# Patient Record
Sex: Female | Born: 2005 | Race: White | Hispanic: Yes | Marital: Single | Smoking: Never smoker
Health system: Southern US, Community
[De-identification: ages and names within clinical notes are randomized; demographics above are authoritative.]

## PROBLEM LIST (undated history)

## (undated) DIAGNOSIS — Z0101 Encounter for examination of eyes and vision with abnormal findings: Secondary | ICD-10-CM

## (undated) DIAGNOSIS — Z8709 Personal history of other diseases of the respiratory system: Secondary | ICD-10-CM

## (undated) DIAGNOSIS — N390 Urinary tract infection, site not specified: Secondary | ICD-10-CM

## (undated) HISTORY — DX: Urinary tract infection, site not specified: N39.0

## (undated) HISTORY — DX: Encounter for examination of eyes and vision with abnormal findings: Z01.01

## (undated) HISTORY — DX: Personal history of other diseases of the respiratory system: Z87.09

---
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
## 2023-01-02 ENCOUNTER — Encounter: Payer: Self-pay | Admitting: Family

## 2023-01-02 ENCOUNTER — Ambulatory Visit (INDEPENDENT_AMBULATORY_CARE_PROVIDER_SITE_OTHER): Payer: Medicaid Other | Admitting: Family

## 2023-01-02 ENCOUNTER — Other Ambulatory Visit (HOSPITAL_BASED_OUTPATIENT_CLINIC_OR_DEPARTMENT_OTHER): Payer: Medicaid Other

## 2023-01-02 VITALS — BP 111/73 | HR 77 | Ht <= 58 in | Wt 136.2 lb

## 2023-01-02 DIAGNOSIS — L700 Acne vulgaris: Secondary | ICD-10-CM

## 2023-01-02 DIAGNOSIS — Z3042 Encounter for surveillance of injectable contraceptive: Secondary | ICD-10-CM | POA: Diagnosis not present

## 2023-01-02 DIAGNOSIS — L309 Dermatitis, unspecified: Secondary | ICD-10-CM

## 2023-01-02 MED ORDER — MEDROXYPROGESTERONE ACETATE 150 MG/ML IM SUSP
150.0000 mg | Freq: Once | INTRAMUSCULAR | Status: AC
Start: 2023-01-02 — End: 2023-01-02
  Administered 2023-01-02: 150 mg via INTRAMUSCULAR

## 2023-01-02 MED ORDER — CLINDAMYCIN PHOS-BENZOYL PEROX 1.2-5 % EX GEL: 1.0000 | Freq: Every day | CUTANEOUS | 5 refills | Status: DC

## 2023-01-02 MED ORDER — HYDROCORTISONE 0.5 % EX OINT
1.0000 | TOPICAL_OINTMENT | Freq: Two times a day (BID) | CUTANEOUS | 0 refills | Status: DC
Start: 2023-01-02 — End: 2023-01-03

## 2023-01-02 NOTE — Patient Instructions (Addendum)
It was great to see you today! Return if those spots worsen of if you notice new spots in other areas.  Try Hydrocortisone cream on the affected areas. Let me know how it goes. You can send me a My Chart message!   Return in 12 weeks for Depo again or sooner if needed!   Take Care!  [employer]

## 2023-01-02 NOTE — Progress Notes (Signed)
History was provided by the patient.  Sophia [name] is a 17 y.o. female who is here for Depo provera and acne follow-up.   PCP confirmed? Yes.    Ettefagh, Aron Baba, MD  Plan from last visit:  1. Encounter for general adult medical examination without abnormal findings Discussed importance of adequate sleep.  Recommend discussing sleep schedule with roommate and consider ear plugs and eye covers to help with sleep.    2. BMI (body mass index), pediatric, 85% to less than 95% for age   22. Routine screening for STI (sexually transmitted infection) - Urine cytology ancillary only   4. Screening for human immunodeficiency virus - POCT Rapid HIV - negative   5. Negative pregnancy test - POCT urine pregnancy   6. Surveillance of contraceptive injection Discussed possibility of early pregnancy given that she had unprotected sex about 1 week ago.  Discussed risks of depo administration in early pregnancy - patient would like to proceed with Depo today.  Recommend use of condoms for the next 7 days and repeat home pregnancy test in 2-4 weeks - medroxyPROGESTERone (DEPO-PROVERA) injection 150 mg - DG Bone Density   7. Seasonal allergies - cetirizine (ZYRTEC) 10 MG tablet; Take 1 tablet (10 mg total) by mouth daily.  Dispense: 30 tablet; Refill: 2 - fluticasone (FLONASE) 50 MCG/ACT nasal spray; Place 1-2 sprays into both nostrils daily.  Dispense: 16 g; Refill: 12   8. Acne vulgaris Discussed acne treatment options.  Rx provided.  Will recheck acne at Depo follow-up appointment when she is on winter break or sooner as needed. - Clindamycin-Benzoyl Per, Refr, gel; Apply 1 Application topically daily.  Dispense: 45 g; Refill: 5   9. Migraine without aura and without status migrainosus, not intractable Discussed importance of adequate sleep to reduce migraine frequency No red flags for elevated ICP.  Start magnesium oxide supplement to help reduce headache fequency.  Follow-up with  neurologist if headaches worsen or fail to improving.     Hearing screening result:normal Vision screening result: normal   Counseling provided for all of the vaccine components     Orders Placed This Encounter  Procedures   Flu vaccine trivalent PF, 6mos and older(Flulaval,Afluria,Fluarix,Fluzone)   Meningococcal B, OMV      Return for follow-up acne and depo in 3 months with Bernell List     HPI:  -uses benzaclin gel once daily after shower  -gets tiny  bumps on forehead + some irritation on her eyelids at time -using charcoal face wash and something her mom's friend gave her   -has a little rash on stomach x 2 months; gets better then worse; at first itched and then it would hurt when she would scratch it so she stopped scratching it; only used lotion for it; thinks she has a little on her L thigh   LMP ended last week, started around Thanksgiving; seems like it was shorter than normal -skipped before that; wants to continue with Depo; gets cramping but not too bad    Patient Active Problem List   Diagnosis Date Noted   Seasonal allergic rhinitis due to pollen 05/14/2020   Migraine without aura and without status migrainosus, not intractable 04/26/2020   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     cetirizine (ZYRTEC) 10 MG tablet Take 1 tablet (10 mg total) by mouth daily. 30 tablet 2   Clindamycin-Benzoyl Per, Refr, gel Apply 1 Application topically daily. 45 g 5   famotidine (PEPCID) 20 MG tablet Take 1 tablet (20 mg total) by mouth daily.  (Patient not taking: Reported on 01/13/2022) 30 tablet 2   fluticasone (FLONASE) 50 MCG/ACT nasal spray Place 1-2 sprays into both nostrils daily. 16 g 12   ibuprofen (ADVIL) 200 MG tablet Take 200 mg by mouth every 6 (six) hours as needed.     melatonin 1 MG TABS tablet Take 3 mg by mouth at bedtime. (Patient not taking: Reported on 10/21/2021)     naproxen (NAPROSYN) 250 MG tablet Take 1 tablet (250 mg total) by mouth 2 (two) times daily with a meal. As needed for menstrual cramps. (Patient not taking: Reported on 10/21/2021) 30 tablet 1   No current facility-administered medications on file prior to visit.    No Known Allergies  Physical Exam:    Vitals:   01/02/23 1335  BP: 111/73  Pulse: 77  Weight: 136 lb 3.2 oz (61.8 kg)  Height: 4\' 10"  (1.473 m)    Blood pressure %iles are not available for patients who are 18 years or older. No LMP recorded.  Physical Exam Constitutional:      General: She is not in acute distress.    Appearance: She is well-developed.  HENT:     Head: Normocephalic and atraumatic.  Eyes:     General: No scleral icterus.    Pupils: Pupils are equal, round, and reactive to light.  Neck:     Thyroid: No thyromegaly.  Cardiovascular:     Rate and Rhythm: Normal rate and regular rhythm.     Heart sounds: Normal heart sounds. No murmur heard. Pulmonary:     Effort: Pulmonary effort is normal.     Breath sounds: Normal breath sounds.  Abdominal:     Palpations: Abdomen is soft.  Musculoskeletal:        General: Normal range of motion.     Cervical back: Normal range of motion and neck supple.  Lymphadenopathy:     Cervical: No cervical adenopathy.  Skin:    General: Skin is warm and dry.     Findings: No rash.     Comments: Small dry erythamatous patch approx 2 x 4 cm on L of umbilicus, same size and description on L groin area; intact, no sign of infection  Neurological:     Mental Status: She is alert and oriented to person, place, and time.      Cranial Nerves: No cranial nerve deficit.  Psychiatric:        Behavior: Behavior normal.        Thought Content: Thought content normal.        Judgment: Judgment normal.      Assessment/Plan:  1. Acne vulgaris (Primary) -doing well, advised to switch face wash to Cetaphil Face Wash instead; continue with Benzaclin - Clindamycin-Benzoyl Per, Refr, gel; Apply 1 Application topically daily.  Dispense: 45 g; Refill: 5  2. Encounter for Depo-Provera contraception -continue on depo window  - medroxyPROGESTERone (DEPO-PROVERA) injection 150 mg  3. Eczema, unspecified type -try hydrocortisone, return precautions reviewed for new or worsening  - hydrocortisone  ointment 0.5 %; Apply 1 Application topically 2 (two) times daily.  Dispense: 30 g; Refill: 0

## 2023-01-03 ENCOUNTER — Other Ambulatory Visit: Payer: Self-pay | Admitting: Family

## 2023-01-03 ENCOUNTER — Ambulatory Visit (HOSPITAL_BASED_OUTPATIENT_CLINIC_OR_DEPARTMENT_OTHER)
Admission: RE | Admit: 2023-01-03 | Discharge: 2023-01-03 | Disposition: A | Discharge status: Home / Self Care | Payer: Medicaid Other | Source: Ambulatory Visit | Attending: Pediatrics | Admitting: Pediatrics

## 2023-01-03 DIAGNOSIS — L309 Dermatitis, unspecified: Secondary | ICD-10-CM

## 2023-01-03 DIAGNOSIS — Z3042 Encounter for surveillance of injectable contraceptive: Secondary | ICD-10-CM | POA: Insufficient documentation

## 2023-01-03 DIAGNOSIS — M810 Age-related osteoporosis without current pathological fracture: Secondary | ICD-10-CM | POA: Diagnosis not present

## 2023-02-06 ENCOUNTER — Encounter: Payer: Self-pay | Admitting: Family

## 2023-02-07 ENCOUNTER — Encounter: Payer: Medicaid Other | Admitting: Family

## 2023-02-08 ENCOUNTER — Telehealth: Payer: Medicaid Other | Admitting: Family

## 2023-02-08 ENCOUNTER — Encounter: Payer: Self-pay | Admitting: Family

## 2023-02-08 DIAGNOSIS — L700 Acne vulgaris: Secondary | ICD-10-CM

## 2023-02-08 DIAGNOSIS — Z3042 Encounter for surveillance of injectable contraceptive: Secondary | ICD-10-CM

## 2023-02-08 MED ORDER — ADAPALENE-BENZOYL PEROXIDE 0.1-2.5 % EX GEL: CUTANEOUS | 1 refills | Status: DC

## 2023-02-08 NOTE — Progress Notes (Signed)
THIS RECORD MAY CONTAIN CONFIDENTIAL INFORMATION THAT SHOULD NOT BE RELEASED WITHOUT REVIEW OF THE SERVICE PROVIDER.  Virtual Follow-Up Visit via Video Note  I connected with Sophia [name]   on 02/08/23 at  5:00 PM EST by a video enabled telemedicine application and verified that I am speaking with the correct person using two identifiers.   Patient/parent location: [facility]  Provider location: remote [place]   I discussed the limitations of evaluation and management by telemedicine and the availability of in person appointments.  I discussed that the purpose of this telehealth visit is to provide medical care while limiting exposure to the novel coronavirus.  The patient expressed understanding and agreed to proceed.   Sophia [name] is a 18 y.o. female referred by Ettefagh, Aron Baba, MD here today for follow-up of acne vulgaris.  History was provided by the patient.  Supervising Physician: Dr. Theadore Nan   Plan from Last Visit:   1. Acne vulgaris (Primary) -doing well, advised to switch face wash to Cetaphil Face Wash instead; continue with Benzaclin - Clindamycin-Benzoyl Per, Refr, gel; Apply 1 Application topically daily.  Dispense: 45 g; Refill: 5   2. Encounter for Depo-Provera contraception -continue on depo window  - medroxyPROGESTERone (DEPO-PROVERA) injection 150 mg   3. Eczema, unspecified type -try hydrocortisone, return precautions reviewed for new or worsening  - hydrocortisone ointment 0.5 %; Apply 1 Application topically 2 (two) times daily.  Dispense: 30 g; Refill: 0  Chief Complaint:   History of Present Illness:  -forgot it when she came back to [name] from break  -using a charcoal cleanser and a cream  -no period since Depo, no cramping; needs to change next appt date  -would like to try something different; has used benzaclin for a while and still having breakouts; feels her skin is too oily to use Cetaphil Gentle Cleanser   No  Known Allergies Outpatient Medications Prior to Visit  Medication Sig Dispense Refill   cetirizine (ZYRTEC) 10 MG tablet Take 1 tablet (10 mg total) by mouth daily. 30 tablet 2   Clindamycin-Benzoyl Per, Refr, gel Apply 1 Application topically daily. 45 g 5   fluticasone (FLONASE) 50 MCG/ACT nasal spray Place 1-2 sprays into both nostrils daily. 16 g 12   hydrocortisone cream 0.5 % APPLY TO AFFECTED AREA TWICE A DAY 28.4 g 0   ibuprofen (ADVIL) 200 MG tablet Take 200 mg by mouth every 6 (six) hours as needed.     No facility-administered medications prior to visit.     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  Visual Observations/Objective:   General Appearance: Well nourished well developed, in no apparent distress.  Eyes: conjunctiva no swelling or erythema ENT/Mouth: No hoarseness, No cough for duration of visit.  Neck: Supple  Respiratory: Respiratory effort normal, normal rate, no retractions or distress.   Cardio: Appears well-perfused, noncyanotic Musculoskeletal: no  obvious deformity Skin: scant mixed comedones forehead  Neuro: Awake and oriented X 3,  Psych:  normal affect, Insight and Judgment appropriate.    Assessment/Plan: 1. Acne vulgaris (Primary) -will try Epiduo (adapalene/H2O2 combo); d/c  benzaclin -trial hydrocortisone for scarring/hyperpigmentation  -return precautions reviewed   2. Surveillance of contraceptive injection -doing well on Depo; no period since last injection -continue with method; requesting to change next appt  I discussed the assessment and treatment plan with the patient and/or parent/guardian.  They were provided an opportunity to ask questions and all were answered.  They agreed with the plan and demonstrated an understanding of the instructions. They were advised to call back or seek an in-person evaluation in the emergency room if the symptoms worsen or if the condition fails to improve as anticipated.   Follow-up:   return in Depo window      Georges Mouse, NP    CC: Ettefagh, Aron Baba, MD, Ettefagh, Aron Baba, MD

## 2023-03-20 ENCOUNTER — Ambulatory Visit (INDEPENDENT_AMBULATORY_CARE_PROVIDER_SITE_OTHER): Payer: Medicaid Other | Admitting: Family

## 2023-03-20 ENCOUNTER — Encounter: Payer: Self-pay | Admitting: Family

## 2023-03-20 ENCOUNTER — Other Ambulatory Visit (HOSPITAL_COMMUNITY)
Admission: RE | Admit: 2023-03-20 | Discharge: 2023-03-20 | Disposition: A | Discharge status: Home / Self Care | Source: Ambulatory Visit | Attending: Family | Admitting: Family

## 2023-03-20 VITALS — BP 114/75 | HR 85 | Ht <= 58 in | Wt 141.6 lb

## 2023-03-20 DIAGNOSIS — Z3202 Encounter for pregnancy test, result negative: Secondary | ICD-10-CM

## 2023-03-20 DIAGNOSIS — L309 Dermatitis, unspecified: Secondary | ICD-10-CM | POA: Diagnosis not present

## 2023-03-20 DIAGNOSIS — Z113 Encounter for screening for infections with a predominantly sexual mode of transmission: Secondary | ICD-10-CM | POA: Diagnosis present

## 2023-03-20 DIAGNOSIS — L700 Acne vulgaris: Secondary | ICD-10-CM

## 2023-03-20 DIAGNOSIS — Z3042 Encounter for surveillance of injectable contraceptive: Secondary | ICD-10-CM

## 2023-03-20 DIAGNOSIS — Z3200 Encounter for pregnancy test, result unknown: Secondary | ICD-10-CM

## 2023-03-20 LAB — POCT URINE PREGNANCY: Preg Test, Ur: NEGATIVE

## 2023-03-20 MED ORDER — MEDROXYPROGESTERONE ACETATE 150 MG/ML IM SUSP
150.0000 mg | Freq: Once | INTRAMUSCULAR | Status: AC
Start: 2023-03-20 — End: 2023-03-20
  Administered 2023-03-20: 150 mg via INTRAMUSCULAR

## 2023-03-20 MED ORDER — ADAPALENE-BENZOYL PEROXIDE 0.1-2.5 % EX GEL: CUTANEOUS | 1 refills | Status: DC

## 2023-03-20 NOTE — Progress Notes (Signed)
 History was provided by the patient.  Sophia [name] is a 18 y.o. female who is here for acne vulgaris, depo.   PCP confirmed? Yes.    Ettefagh, Aron Baba, MD  HPI:   -acne worsening; spreading to back and chest -hasn't used the benzaclin due to issue at pharmacy  -using two different face washes - charcoal and PanOxyl; and Nivea soft cream for moisturizer -has a dark spot between breast, does not itch; has been there about a month; no other issues or spots noted.   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     Adapalene-Benzoyl Peroxide 0.1-2.5 % gel Apply topically to affected facial areas nightly at bedtime. (Patient not taking: Reported on 03/20/2023) 45 g 1   cetirizine (ZYRTEC) 10 MG tablet Take 1 tablet (10 mg total) by mouth daily. (Patient not taking: Reported on 03/20/2023) 30 tablet 2   fluticasone (FLONASE) 50 MCG/ACT nasal spray Place 1-2 sprays into both nostrils daily. (Patient not taking: Reported on 03/20/2023) 16 g 12   No current facility-administered medications on file prior to visit.    No Known Allergies  Physical Exam:    Vitals:   03/20/23 1002  BP: 114/75  Pulse: 85  Weight: 141 lb  9.6 oz (64.2 kg)  Height: 4\' 10"  (1.473 m)    Blood pressure %iles are not available for patients who are 18 years or older. No LMP recorded.  Physical Exam Exam conducted with a chaperone present Esmond Harps).  Constitutional:      General: She is not in acute distress.    Appearance: She is well-developed.  HENT:     Head: Normocephalic and atraumatic.  Eyes:     General: No scleral icterus.    Pupils: Pupils are equal, round, and reactive to light.  Neck:     Thyroid: No thyromegaly.  Cardiovascular:     Rate and Rhythm: Normal rate and regular rhythm.     Heart sounds: Normal heart sounds. No murmur heard. Pulmonary:     Effort: Pulmonary effort is normal.     Breath sounds: Normal breath sounds.  Chest:    Abdominal:     Palpations: Abdomen is soft.  Musculoskeletal:        General: Normal range of motion.     Cervical back: Normal range of motion and neck supple.  Lymphadenopathy:     Cervical: No cervical adenopathy.  Skin:    General: Skin is warm and dry.     Findings: No rash.  Neurological:     Mental Status: She is alert and oriented to person, place, and time.     Cranial Nerves: No cranial nerve deficit.  Psychiatric:        Behavior: Behavior normal.        Thought Content: Thought content normal.        Judgment: Judgment normal.      Assessment/Plan: 1. Acne vulgaris (Primary) Acne Plan  Products: Face Wash:  Use a gentle cleanser, such as Cetaphil (generic version of this is fine) Moisturizer:  Use an "oil-free" moisturizer with SPF Prescription Cream(s):  none in the morning and Benzaclin at bedtime  Morning: Wash face, then completely dry Apply Moisturizer to entire face  Bedtime: Wash face, then completely dry Apply Benzaclin gel, pea size amount that you massage into problem areas on the face.  Remember: Your acne will probably get worse before it gets better It takes at least 2 months for the medicines to start working Use oil  free soaps and lotions; these can be over the counter or store-brand Don't use harsh scrubs or astringents, these can make skin irritation and acne worse Moisturize daily with oil free lotion because the acne medicines will dry your skin  Call your doctor if you have: Lots of skin dryness or redness that doesn't get better if you use a moisturizer or if you use the prescription cream or lotion every other day    Stop using the acne medicine immediately and see your doctor if you are or become pregnant or if you think you had an allergic reaction (itchy rash, difficulty breathing, nausea, vomiting) to your acne medication.  - Adapalene-Benzoyl Peroxide 0.1-2.5 % gel; Apply topically to affected facial areas nightly at bedtime.  Dispense: 45 g; Refill: 1  2. Encounter for Depo-Provera contraception -continue with method; return precautions reviewed  - medroxyPROGESTERone (DEPO-PROVERA) injection 150 mg  3. Eczema, unspecified type -moisturize area; use hydrocortisone (she has at home) on affected area; return precautions reviewed  4. Routine screening for STI (sexually transmitted infection) - Urine cytology ancillary only  5. Pregnancy examination or test, pregnancy unconfirmed - POCT urine pregnancy  Return in 10 weeks or sooner.

## 2023-03-21 LAB — URINE CYTOLOGY ANCILLARY ONLY
Bacterial Vaginitis-Urine: NEGATIVE
Candida Urine: NEGATIVE
Chlamydia: NEGATIVE
Comment: NEGATIVE
Comment: NEGATIVE
Comment: NORMAL
Neisseria Gonorrhea: NEGATIVE
Trichomonas: NEGATIVE

## 2023-04-03 ENCOUNTER — Encounter: Payer: Self-pay | Admitting: Family

## 2023-04-03 ENCOUNTER — Encounter: Payer: Medicaid Other | Admitting: Family

## 2023-05-30 ENCOUNTER — Encounter: Admitting: Family

## 2023-06-01 ENCOUNTER — Ambulatory Visit (INDEPENDENT_AMBULATORY_CARE_PROVIDER_SITE_OTHER): Admitting: Family

## 2023-06-01 VITALS — BP 112/75 | HR 76 | Ht <= 58 in | Wt 144.4 lb

## 2023-06-01 DIAGNOSIS — L700 Acne vulgaris: Secondary | ICD-10-CM

## 2023-06-01 DIAGNOSIS — Z3042 Encounter for surveillance of injectable contraceptive: Secondary | ICD-10-CM

## 2023-06-01 MED ORDER — CLINDAMYCIN PHOS-BENZOYL PEROX 1.2-5 % EX GEL: CUTANEOUS | 0 refills | Status: DC

## 2023-06-01 MED ORDER — MEDROXYPROGESTERONE ACETATE 150 MG/ML IM SUSP
150.0000 mg | Freq: Once | INTRAMUSCULAR | Status: AC
Start: 2023-06-01 — End: 2023-06-01
  Administered 2023-06-01: 150 mg via INTRAMUSCULAR

## 2023-06-01 NOTE — Progress Notes (Unsigned)
 History was provided by the {[email or web address]}.  Sophia [name] is a 18 y.o. female who is here for ***.   PCP confirmed? {yes [id number]}  Ettefagh, Micah Ade, MD 1. Acne vulgaris (Primary) Acne Plan   Products: Face Wash:  Use a gentle cleanser, such as Cetaphil (generic version of this is fine) Moisturizer:  Use an "oil-free" moisturizer with SPF Prescription Cream(s):  none in the morning and Benzaclin at bedtime   Morning: Wash face, then completely dry Apply Moisturizer to entire face   Bedtime: Wash face, then completely dry Apply Benzaclin gel, pea size amount that you massage into problem areas on the face.   Remember: Your acne will probably get worse before it gets better It takes at least 2 months for the medicines to start working Use oil free soaps and lotions; these can be over the counter or store-brand Don't use harsh scrubs or astringents, these can make skin irritation and acne worse Moisturize daily with oil free lotion because the acne medicines will dry your skin   Call your doctor if you have: Lots of skin dryness or redness that doesn't get better if you use a moisturizer or if you use the prescription cream or lotion every other day      Stop using the acne medicine immediately and see your doctor if you are or become pregnant or if you think you had an allergic reaction (itchy rash, difficulty breathing, nausea, vomiting) to your acne medication.   - Adapalene -Benzoyl Peroxide  0.1-2.5 % gel; Apply topically to affected facial areas nightly at bedtime.  Dispense: 45 g; Refill: 1   2. Encounter for Depo-Provera  contraception -continue with method; return precautions reviewed  - medroxyPROGESTERone  (DEPO-PROVERA ) injection 150 mg   3. Eczema, unspecified type -moisturize area; use hydrocortisone  (she has at home) on affected area; return precautions reviewed   4. Routine screening for STI (sexually transmitted infection) - Urine cytology  ancillary only   5. Pregnancy examination or test, pregnancy unconfirmed - POCT urine pregnancy   Return in 10 weeks or sooner.    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     Adapalene -Benzoyl Peroxide  0.1-2.5 % gel Apply topically to affected facial areas nightly at bedtime. (Patient not taking: Reported on 06/01/2023) 45 g 1   cetirizine  (ZYRTEC ) 10 MG tablet Take 1 tablet (10 mg total) by mouth daily. (Patient not taking: Reported on 06/01/2023) 30 tablet 2   fluticasone  (FLONASE ) 50 MCG/ACT nasal spray Place 1-2 sprays into both nostrils daily. (Patient not taking: Reported  on 06/01/2023) 16 g 12   No current facility-administered medications on file prior to visit.    No Known Allergies  Physical Exam:    Vitals:   06/01/23 1336  BP: 112/75  Pulse: 76  Weight: 144 lb 6.4 oz (65.5 kg)  Height: 4\' 10"  (1.473 m)    Blood pressure %iles are not available for patients who are 18 years or older. No LMP recorded.  Physical Exam   Assessment/Plan: ***

## 2023-06-02 ENCOUNTER — Encounter: Payer: Self-pay | Admitting: Family

## 2023-08-10 ENCOUNTER — Other Ambulatory Visit (HOSPITAL_COMMUNITY)
Admission: RE | Admit: 2023-08-10 | Discharge: 2023-08-10 | Disposition: A | Discharge status: Home / Self Care | Source: Ambulatory Visit | Attending: Family | Admitting: Family

## 2023-08-10 ENCOUNTER — Encounter: Payer: Self-pay | Admitting: Family

## 2023-08-10 ENCOUNTER — Ambulatory Visit: Admitting: Family

## 2023-08-10 VITALS — BP 116/75 | HR 70 | Ht <= 58 in | Wt 142.6 lb

## 2023-08-10 DIAGNOSIS — Z3042 Encounter for surveillance of injectable contraceptive: Secondary | ICD-10-CM | POA: Diagnosis not present

## 2023-08-10 DIAGNOSIS — Z130 Encounter for screening for diseases of the blood and blood-forming organs and certain disorders involving the immune mechanism: Secondary | ICD-10-CM

## 2023-08-10 DIAGNOSIS — Z3202 Encounter for pregnancy test, result negative: Secondary | ICD-10-CM | POA: Diagnosis not present

## 2023-08-10 DIAGNOSIS — R300 Dysuria: Secondary | ICD-10-CM

## 2023-08-10 DIAGNOSIS — R82998 Other abnormal findings in urine: Secondary | ICD-10-CM | POA: Diagnosis not present

## 2023-08-10 DIAGNOSIS — Z113 Encounter for screening for infections with a predominantly sexual mode of transmission: Secondary | ICD-10-CM | POA: Diagnosis present

## 2023-08-10 DIAGNOSIS — L309 Dermatitis, unspecified: Secondary | ICD-10-CM

## 2023-08-10 DIAGNOSIS — J302 Other seasonal allergic rhinitis: Secondary | ICD-10-CM | POA: Diagnosis not present

## 2023-08-10 DIAGNOSIS — L700 Acne vulgaris: Secondary | ICD-10-CM | POA: Diagnosis not present

## 2023-08-10 DIAGNOSIS — R2689 Other abnormalities of gait and mobility: Secondary | ICD-10-CM

## 2023-08-10 DIAGNOSIS — E559 Vitamin D deficiency, unspecified: Secondary | ICD-10-CM

## 2023-08-10 LAB — POCT URINALYSIS DIPSTICK
Bilirubin, UA: NEGATIVE
Blood, UA: NEGATIVE
Glucose, UA: NEGATIVE
Ketones, UA: NEGATIVE
Nitrite, UA: NEGATIVE
Protein, UA: POSITIVE — AB
Spec Grav, UA: 1.02 (ref 1.010–1.025)
Urobilinogen, UA: 1 U/dL
pH, UA: 6.5 (ref 5.0–8.0)

## 2023-08-10 LAB — POCT URINE PREGNANCY: Preg Test, Ur: NEGATIVE

## 2023-08-10 LAB — POCT HEMOGLOBIN: Hemoglobin: 13.1 g/dL (ref 11–14.6)

## 2023-08-10 MED ORDER — CETIRIZINE HCL 10 MG PO TABS: 10.0000 mg | ORAL_TABLET | Freq: Every day | ORAL | 2 refills | Status: DC

## 2023-08-10 MED ORDER — MEDROXYPROGESTERONE ACETATE 150 MG/ML IM SUSP
150.0000 mg | Freq: Once | INTRAMUSCULAR | Status: AC
  Administered 2023-08-10: 150 mg via INTRAMUSCULAR

## 2023-08-10 MED ORDER — TRIAMCINOLONE ACETONIDE 0.5 % EX OINT: 1.0000 | TOPICAL_OINTMENT | Freq: Two times a day (BID) | CUTANEOUS | 0 refills | Status: DC

## 2023-08-10 MED ORDER — TRETINOIN 0.01 % EX GEL: Freq: Every day | CUTANEOUS | 0 refills | Status: DC

## 2023-08-10 NOTE — Progress Notes (Signed)
 History was provided by the patient.  Sophia [name] is a 18 y.o. female who is here for Depo provera .   PCP confirmed? Yes.    Ettefagh, Mallie Hamilton, MD  Plan from last visit:  1. Acne vulgaris (Primary) -trial Duac at bedtime; consider adding Retin-A  once she adjusts to Duac (switch to morning if Retin-A  added at night) -return for next Depo in 10 weeks and reassess acne or sooner if needed 2. Encounter for Depo-Provera  contraception -continue with depo; completed DXA on 01/04/23 normal scan; next DXA due 01/03/25 - medroxyPROGESTERone  (DEPO-PROVERA ) injection 150 mg    Pertinent Labs:  Negative gc/c 03/20/23  Chart/Growth Chart Review:  Body mass index is 29.8 kg/m.   HPI:     Birth Control Side Effect Management:  Compliance? In depo window Bleeding Pattern? Just started less than a week ago, but really light LMP? No bleeding prior to that Cramping? No concerns Mood concerns/changes? Feels like mood flips easily; safe to self; declines therapy; about to go back to [place] Any other side effects? Not sure if related to Depo, but has been losing balance a lot lately; fallen a few times from losing balance - out of no where; works at [employer] but symptoms do not occur during work shifts; about a month going on; no head trauma; has always caught herself; appetite has been good; has also been feeling super tired - has been going on for a long time but lately sleeping at least 8 hours and still feels tired throughout the day;  Caffeine: almost none; no vaping, no nicotine, no drug use; some headaches, thought normal migraines; never feel dizzy; will just be walking and will stumble to the side. No ear pain, no ringing in ears; no tingling or numbness  Sexually active? yes Pain with intercourse? Sometimes burns to pee, in the few days after; no vaginal discharge changes; no pelvic pain   Itching on L arm fold, some itching and redness on eyelids; will swell some then go away    ROS  Headaches?  No changes Vision changes? Has glasses she uses for school work Chest pain? no SOB? no Muscle pain/Joint pain? no Rashes or lesions? 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 45 g 0   ibuprofen  (ADVIL ) 200 MG tablet Take 200 mg by mouth every 6 (six) hours as needed.     Adapalene -Benzoyl Peroxide  0.1-2.5 % gel Apply topically to affected facial areas nightly at bedtime. (Patient not taking: Reported on 08/10/2023) 45 g 1   cetirizine  (ZYRTEC ) 10 MG tablet Take 1 tablet (10 mg total) by mouth daily. (Patient not taking: Reported on 08/10/2023) 30 tablet 2   fluticasone  (FLONASE ) 50 MCG/ACT nasal spray Place 1-2 sprays into both nostrils daily. (Patient not taking: Reported on 08/10/2023) 16 g 12   hydrocortisone  cream 0.5 % APPLY TO AFFECTED AREA TWICE A DAY (Patient  not taking: Reported on 08/10/2023) 28.4 g 0   No current facility-administered medications on file prior to visit.    No Known Allergies  Physical Exam:    Vitals:   08/10/23 1337  BP: 116/75  Pulse: 70  Weight: 142 lb 9.6 oz (64.7 kg)  Height: 4' 10 (1.473 m)   Wt Readings from Last 3 Encounters:  08/10/23 142 lb 9.6 oz (64.7 kg) (73%, Z= 0.61)*  06/01/23 144 lb 6.4 oz (65.5 kg) (75%, Z= 0.69)*  03/20/23 141 lb 9.6 oz (64.2 kg) (73%, Z= 0.61)*   * Growth  percentiles are based on CDC (Girls, 2-20 Years) data.    Blood pressure %iles are not available for patients who are 18 years or older.   Physical Exam Vitals and nursing note reviewed.  Constitutional:      General: She is not in acute distress.    Appearance: She is well-developed.  HENT:     Right Ear: Tympanic membrane normal.     Left Ear: Tympanic membrane normal.     Nose: Nose normal. No congestion.     Mouth/Throat:     Pharynx: No oropharyngeal exudate.  Eyes:     Extraocular Movements: Extraocular movements intact.     Pupils: Pupils are equal, round, and reactive to light.  Neck:     Thyroid : No thyromegaly.  Cardiovascular:     Rate and Rhythm: Normal rate and regular rhythm.     Heart sounds: No murmur heard. Pulmonary:     Breath sounds: Normal breath sounds.  Abdominal:     Palpations: Abdomen is soft. There is no mass.     Tenderness: There is no abdominal tenderness. There is no guarding.  Genitourinary:    Comments: Deferred  Musculoskeletal:     Right lower leg: No edema.     Left lower leg: No edema.  Lymphadenopathy:     Cervical: No cervical adenopathy.  Skin:    General: Skin is warm.     Capillary Refill: Capillary refill takes less than 2 seconds.     Findings: No rash.  Neurological:     General: No focal deficit present.     Mental Status: She is alert and oriented to person, place, and time.     Motor: No weakness or tremor.     Coordination: Coordination normal.     Comments: No tremor  Psychiatric:        Attention and Perception: Attention normal.        Mood and Affect: Mood normal.        Speech: Speech normal.      Assessment/Plan:  1. Balance problem (Primary) -overall, assessment is reassuring today; unclear etiology of symptoms of balance problem; low threshold for Neuro referral; will assess thyroid , A1c and b12/folate for possible etiologies; she works on her feet all day at a diner and does not have symptoms during  this time; will also assess for UTI today; last gc/c was negative in March  - TSH + free T4 - Lipid panel - Comprehensive metabolic panel with GFR - Hemoglobin A1c - CBC with Differential/Platelet - B12 and Folate Panel  2. Dysuria - POCT urinalysis dipstick  3. Acne vulgaris Add Retin-A  to PM; continue with Benzaclin gel in AM   4. Eczema, unspecified type -Kenalog  for topical use on AC/affected areas of eczema   5. Vitamin D  deficiency -screening today  - VITAMIN D  25 Hydroxy (Vit-D Deficiency, Fractures)  6. Seasonal allergies -advised to  restart; eyelid rash ddx allergy vs eczema; advised to only use barrier cream/emollient on eyelide  - cetirizine  (ZYRTEC ) 10 MG tablet; Take 1 tablet (10 mg total) by mouth daily.  Dispense: 34 tablet; Refill: 2  7. Encounter for Depo-Provera  contraception -proceeds with Depo today  8. Screening for iron deficiency anemia -hgb is reassuring today  Lab Results  Component Value Date   HGB 13.1 08/10/2023    - POCT hemoglobin

## 2023-08-11 ENCOUNTER — Other Ambulatory Visit: Payer: Self-pay | Admitting: Family

## 2023-08-11 ENCOUNTER — Ambulatory Visit: Payer: Self-pay | Admitting: Family

## 2023-08-11 LAB — CBC WITH DIFFERENTIAL/PLATELET
Absolute Lymphocytes: 3309 {cells}/uL (ref 850–3900)
Absolute Monocytes: 564 {cells}/uL (ref 200–950)
Basophils Absolute: 28 {cells}/uL (ref 0–200)
Basophils Relative: 0.3 %
Eosinophils Absolute: 122 {cells}/uL (ref 15–500)
Eosinophils Relative: 1.3 %
HCT: 42.4 % (ref 35.0–45.0)
Hemoglobin: 13.4 g/dL (ref 11.7–15.5)
MCH: 27.7 pg (ref 27.0–33.0)
MCHC: 31.6 g/dL — ABNORMAL LOW (ref 32.0–36.0)
MCV: 87.6 fL (ref 80.0–100.0)
MPV: 9.5 fL (ref 7.5–12.5)
Monocytes Relative: 6 %
Neutro Abs: 5377 {cells}/uL (ref 1500–7800)
Neutrophils Relative %: 57.2 %
Platelets: 367 Thousand/uL (ref 140–400)
RBC: 4.84 Million/uL (ref 3.80–5.10)
RDW: 12.2 % (ref 11.0–15.0)
Total Lymphocyte: 35.2 %
WBC: 9.4 Thousand/uL (ref 3.8–10.8)

## 2023-08-11 LAB — COMPREHENSIVE METABOLIC PANEL WITH GFR
AG Ratio: 1.6 (calc) (ref 1.0–2.5)
ALT: 19 U/L (ref 5–32)
AST: 16 U/L (ref 12–32)
Albumin: 4.7 g/dL (ref 3.6–5.1)
Alkaline phosphatase (APISO): 94 U/L (ref 36–128)
BUN: 14 mg/dL (ref 7–20)
CO2: 24 mmol/L (ref 20–32)
Calcium: 9.7 mg/dL (ref 8.9–10.4)
Chloride: 103 mmol/L (ref 98–110)
Creat: 0.61 mg/dL (ref 0.50–0.96)
Globulin: 3 g/dL (ref 2.0–3.8)
Glucose, Bld: 91 mg/dL (ref 65–99)
Potassium: 4.2 mmol/L (ref 3.8–5.1)
Sodium: 137 mmol/L (ref 135–146)
Total Bilirubin: 0.3 mg/dL (ref 0.2–1.1)
Total Protein: 7.7 g/dL (ref 6.3–8.2)
eGFR: 132 mL/min/1.73m2 (ref >=60)

## 2023-08-11 LAB — B12 AND FOLATE PANEL
Folate: 8.1 ng/mL
Vitamin B-12: 343 pg/mL (ref 200–1100)

## 2023-08-11 LAB — TSH+FREE T4: TSH W/REFLEX TO FT4: 1.29 m[IU]/L

## 2023-08-11 LAB — HEMOGLOBIN A1C
Hgb A1c MFr Bld: 5.6 % (ref <5.7)
Mean Plasma Glucose: 114 mg/dL
eAG (mmol/L): 6.3 mmol/L

## 2023-08-11 LAB — URINE CYTOLOGY ANCILLARY ONLY
Chlamydia: NEGATIVE
Comment: NEGATIVE
Comment: NEGATIVE
Comment: NORMAL
Neisseria Gonorrhea: NEGATIVE
Trichomonas: NEGATIVE

## 2023-08-11 LAB — LIPID PANEL
Cholesterol: 208 mg/dL — ABNORMAL HIGH (ref <170)
HDL: 35 mg/dL — ABNORMAL LOW (ref >45)
LDL Cholesterol (Calc): 142 mg/dL — ABNORMAL HIGH (ref <110)
Non-HDL Cholesterol (Calc): 173 mg/dL — ABNORMAL HIGH (ref <120)
Total CHOL/HDL Ratio: 5.9 (calc) — ABNORMAL HIGH (ref <5.0)
Triglycerides: 177 mg/dL — ABNORMAL HIGH (ref <90)

## 2023-08-11 LAB — VITAMIN D 25 HYDROXY (VIT D DEFICIENCY, FRACTURES): Vit D, 25-Hydroxy: 26 ng/mL — ABNORMAL LOW (ref 30–100)

## 2023-08-11 MED ORDER — VITAMIN D (ERGOCALCIFEROL) 1.25 MG (50000 UNIT) PO CAPS: 50000.0000 [IU] | ORAL_CAPSULE | ORAL | 0 refills | Status: DC

## 2023-08-12 LAB — URINE CULTURE
MICRO NUMBER:: 16742401
SPECIMEN QUALITY:: ADEQUATE

## 2023-08-14 ENCOUNTER — Other Ambulatory Visit: Payer: Self-pay | Admitting: Family

## 2023-08-14 MED ORDER — NITROFURANTOIN MONOHYD MACRO 100 MG PO CAPS: 100.0000 mg | ORAL_CAPSULE | Freq: Two times a day (BID) | ORAL | 0 refills | Status: DC

## 2023-08-18 ENCOUNTER — Other Ambulatory Visit: Payer: Self-pay | Admitting: Family

## 2023-08-18 MED ORDER — NITROFURANTOIN MONOHYD MACRO 100 MG PO CAPS: 100.0000 mg | ORAL_CAPSULE | Freq: Two times a day (BID) | ORAL | 0 refills | Status: AC

## 2023-08-23 ENCOUNTER — Ambulatory Visit (HOSPITAL_COMMUNITY)
Admission: RE | Admit: 2023-08-23 | Discharge: 2023-08-23 | Disposition: A | Discharge status: Home / Self Care | Source: Ambulatory Visit | Attending: Internal Medicine | Admitting: Internal Medicine

## 2023-08-23 ENCOUNTER — Encounter (HOSPITAL_COMMUNITY): Payer: Self-pay

## 2023-08-23 VITALS — BP 120/77 | HR 60 | Temp 98.2°F | Resp 16

## 2023-08-23 DIAGNOSIS — L089 Local infection of the skin and subcutaneous tissue, unspecified: Secondary | ICD-10-CM

## 2023-08-23 DIAGNOSIS — S90561A Insect bite (nonvenomous), right ankle, initial encounter: Secondary | ICD-10-CM | POA: Diagnosis not present

## 2023-08-23 DIAGNOSIS — W57XXXA Bitten or stung by nonvenomous insect and other nonvenomous arthropods, initial encounter: Secondary | ICD-10-CM | POA: Diagnosis not present

## 2023-08-23 DIAGNOSIS — L03115 Cellulitis of right lower limb: Secondary | ICD-10-CM

## 2023-08-23 MED ORDER — DEXAMETHASONE SODIUM PHOSPHATE 10 MG/ML IJ SOLN
10.0000 mg | Freq: Once | INTRAMUSCULAR | Status: AC
  Administered 2023-08-23: 10 mg via INTRAMUSCULAR

## 2023-08-23 MED ORDER — DEXAMETHASONE SODIUM PHOSPHATE 10 MG/ML IJ SOLN
INTRAMUSCULAR | Status: AC
  Filled 2023-08-23: qty 1

## 2023-08-23 MED ORDER — CEPHALEXIN 500 MG PO CAPS: 500.0000 mg | ORAL_CAPSULE | Freq: Three times a day (TID) | ORAL | 0 refills | Status: AC

## 2023-08-23 NOTE — ED Provider Notes (Signed)
 [facility]    CSN: [id number] Arrival date & time: 08/23/23  1132      History   Chief Complaint Chief Complaint  Patient presents with   Leg Swelling    I was bitten by a mosquito and now my lower leg and ankle are extremely swollen - Entered by patient    HPI Sophia [name] is a 18 y.o. female.   Sophia [name] is a 18 y.o. female presenting for chief complaint of insect bites and leg swelling to the right lower leg that started few days ago.  Swelling has worsened over the last 24 to 48 hours to the right lower extremity due to the insect bites.  Reports itching localized to the insect bites.  She has seen some milky/clear drainage coming out of one of the bites to the anterior right ankle.  Denies streaking redness to the proximal right lower leg, fever, chills, and other recent trauma/injuries to the right foot/leg.  She is currently taking Macrobid  for UTI, denies other recent antibiotic or steroid use.  No history of immunosuppression.     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 No pertinent surgical history.  OB History   No obstetric history on file.      Home Medications    Prior to Admission medications   Medication Sig Start Date End Date Taking? Authorizing Provider   cephALEXin  (KEFLEX ) 500 MG capsule Take 1 capsule (500 mg total) by mouth 3 (three) times daily for 7 days. 08/23/23 08/30/23 Yes Enedelia Dorna HERO, FNP  cetirizine  (ZYRTEC ) 10 MG tablet Take 1 tablet (10 mg total) by mouth daily. 08/10/23   Joshua Bari HERO, NP  Clindamycin -Benzoyl Per, Refr, gel Use topically on affected areas at bedtime. 06/01/23   Joshua Bari HERO, NP  ibuprofen  (ADVIL ) 200 MG tablet Take 200 mg by mouth every 6 (six) hours as needed.    [provider]  nitrofurantoin , macrocrystal-monohydrate, (MACROBID ) 100 MG capsule Take 1 capsule (100 mg total) by mouth 2 (two) times daily for 5 days. 08/18/23 08/23/23  Joshua Bari HERO, NP  tretinoin  (RETIN-A ) 0.01 % gel Apply topically at bedtime. 08/10/23   Joshua Bari HERO, NP  triamcinolone  ointment (KENALOG ) 0.5 % Apply 1 Application topically 2 (two) times daily. A 08/10/23   Joshua Bari HERO, NP  Vitamin D , Ergocalciferol , (DRISDOL ) 1.25 MG (50000 UNIT) CAPS capsule Take 1 capsule (50,000 Units total) by mouth every 7 (seven) days. 08/11/23   Joshua Bari HERO, NP    Family History Family History  Problem Relation Age of Onset   Asperger's syndrome Brother    Autism Brother    Diabetes Maternal Grandmother    Hypertension Maternal Grandfather    Anxiety disorder Mother    Depression Mother    Cancer Maternal Aunt  Varicose Veins Paternal Grandmother    Vitiligo Paternal Grandfather    ADD / ADHD Neg Hx    Bipolar disorder Neg Hx    Schizophrenia Neg Hx     Social History Social History   Tobacco Use   Smoking status: Never    Passive exposure: Never   Smokeless tobacco: Never  Substance Use Topics   Alcohol use: Never   Drug use: Never     Allergies   Patient has no known allergies.   Review of Systems Review of Systems Per HPI  Physical Exam Triage Vital Signs ED Triage Vitals [08/23/23 1153]  Encounter Vitals Group     BP 120/77     Girls Systolic BP Percentile      Girls Diastolic BP  Percentile      Boys Systolic BP Percentile      Boys Diastolic BP Percentile      Pulse Rate 60     Resp 16     Temp 98.2 F (36.8 C)     Temp Source Oral     SpO2 96 %     Weight      Height      Head Circumference      Peak Flow      Pain Score 0     Pain Loc      Pain Education      Exclude from Growth Chart    No data found.  Updated Vital Signs BP 120/77 (BP Location: Right Arm)   Pulse 60   Temp 98.2 F (36.8 C) (Oral)   Resp 16   LMP  (Exact Date)   SpO2 96%   Visual Acuity Right Eye Distance:   Left Eye Distance:   Bilateral Distance:    Right Eye Near:   Left Eye Near:    Bilateral Near:     Physical Exam Vitals and nursing note reviewed.  Constitutional:      Appearance: She is not ill-appearing or toxic-appearing.  HENT:     Head: Normocephalic and atraumatic.     Right Ear: Hearing and external ear normal.     Left Ear: Hearing and external ear normal.     Nose: Nose normal.     Mouth/Throat:     Lips: Pink.  Eyes:     General: Lids are normal. Vision grossly intact. Gaze aligned appropriately.     Extraocular Movements: Extraocular movements intact.     Conjunctiva/sclera: Conjunctivae normal.  Cardiovascular:     Pulses:          Dorsalis pedis pulses are 2+ on the right side and 2+ on the left side.     Comments: +2 bilateral anterior tibialis pulses. Pulmonary:     Effort: Pulmonary effort is normal.  Musculoskeletal:     Cervical back: Neck supple.     Right lower leg: Edema (Trace nonpitting edema to the right ankle surrounding insect bites) present.  Skin:    General: Skin is warm and dry.     Capillary Refill: Capillary refill takes less than 2 seconds.     Findings: Rash present. Rash is papular and pustular.     Comments: Multiple erythematous papular lesions with surrounding erythema present to the right lower extremity.  There is 1 pustular lesion present with milky/clear drainage on exam.  Neurological:     General: No  focal deficit present.     Mental Status: She is alert and oriented to person, place, and time. Mental  status is at baseline.     Cranial Nerves: No dysarthria or facial asymmetry.  Psychiatric:        Mood and Affect: Mood normal.        Speech: Speech normal.        Behavior: Behavior normal.        Thought Content: Thought content normal.        Judgment: Judgment normal.    Right leg/ankle   Right leg/ankle    UC Treatments / Results  Labs (all labs ordered are listed, but only abnormal results are displayed) Labs Reviewed - No data to display  EKG   Radiology No results found.  Procedures Procedures (including critical care time)  Medications Ordered in UC Medications  dexamethasone  (DECADRON ) injection 10 mg (has no administration in time range)    Initial Impression / Assessment and Plan / UC Course  I have reviewed the triage vital signs and the nursing notes.  Pertinent labs & imaging results that were available during my care of the patient were reviewed by me and considered in my medical decision making (see chart for details).   1.  Infected insect bite of right ankle, cellulitis of right lower extremity Erythema surrounding insect bites has been outlined with a skin pen. Cephalexin  antibiotic 3 times daily for 7 days ordered. Dexamethasone  10 mg IM given for acute itching/allergic reaction symptoms secondary to insect bites. Advised to watch for signs of spreading infection and return to clinic as needed.  Follow-up with PCP. Neurovascularly intact distally to injury/infection.  Counseled patient on potential for adverse effects with medications prescribed/recommended today, strict ER and return-to-clinic precautions discussed, patient verbalized understanding.    Final Clinical Impressions(s) / UC Diagnoses   Final diagnoses:  Infected insect bite of right ankle, initial encounter  Cellulitis of right lower extremity     Discharge Instructions       You have cellulitis which is a superficial skin infection.  Take antibiotic every 12 hours with a snack/food for the next 7 days. Watch for worsening signs of infection to the lesion such as spreading redness, swelling, pus drainage, pain, fever/chills. I have drawn a circle around the area of redness with a skin pen today, if the redness grows past the circle significantly in the next 24-48 hours, please return to urgent care for reevaluation.  Return if you develop fever, chills, nausea, vomiting, etc as well.  If you develop any new or worsening symptoms or if your symptoms do not start to improve, please return here or follow-up with your primary care provider. If your symptoms are severe, please go to the emergency room.    ED Prescriptions     Medication Sig Dispense Auth. Provider   cephALEXin  (KEFLEX ) 500 MG capsule Take 1 capsule (500 mg total) by mouth 3 (three) times daily for 7 days. 21 capsule Enedelia Dorna HERO, FNP      PDMP not reviewed this encounter.   Enedelia Dorna HERO, [place] 08/23/23 1223

## 2023-08-23 NOTE — Discharge Instructions (Signed)
 You have cellulitis which is a superficial skin infection.  Take antibiotic every 12 hours with a snack/food for the next 7 days. Watch for worsening signs of infection to the lesion such as spreading redness, swelling, pus drainage, pain, fever/chills. I have drawn a circle around the area of redness with a skin pen today, if the redness grows past the circle significantly in the next 24-48 hours, please return to urgent care for reevaluation.  Return if you develop fever, chills, nausea, vomiting, etc as well.  If you develop any new or worsening symptoms or if your symptoms do not start to improve, please return here or follow-up with your primary care provider. If your symptoms are severe, please go to the emergency room.

## 2023-08-23 NOTE — ED Triage Notes (Signed)
 Pt states has multiple mosquitoes bites to RLE and now having swelling. Used anti itch ointment with little relief.

## 2023-08-24 ENCOUNTER — Encounter: Payer: Self-pay | Admitting: Family

## 2023-08-25 ENCOUNTER — Other Ambulatory Visit: Payer: Self-pay | Admitting: Family

## 2023-08-25 MED ORDER — CLINDAMYCIN PHOS-BENZOYL PEROX 1.2-5 % EX GEL: CUTANEOUS | 0 refills | Status: DC

## 2023-08-25 MED ORDER — FLUTICASONE PROPIONATE 50 MCG/ACT NA SUSP: NASAL | 0 refills | Status: DC

## 2023-10-03 ENCOUNTER — Other Ambulatory Visit: Payer: Self-pay | Admitting: Family

## 2023-10-26 ENCOUNTER — Encounter: Admitting: Family

## 2023-10-26 ENCOUNTER — Encounter: Payer: Self-pay | Admitting: Family

## 2023-10-26 ENCOUNTER — Ambulatory Visit (INDEPENDENT_AMBULATORY_CARE_PROVIDER_SITE_OTHER): Admitting: Family

## 2023-10-26 ENCOUNTER — Encounter: Payer: Self-pay | Admitting: *Deleted

## 2023-10-26 VITALS — BP 113/77 | HR 87 | Ht <= 58 in | Wt 148.8 lb

## 2023-10-26 DIAGNOSIS — Z3042 Encounter for surveillance of injectable contraceptive: Secondary | ICD-10-CM | POA: Diagnosis not present

## 2023-10-26 MED ORDER — MEDROXYPROGESTERONE ACETATE 150 MG/ML IM SUSP
150.0000 mg | Freq: Once | INTRAMUSCULAR | Status: AC
  Administered 2023-10-26: 150 mg via INTRAMUSCULAR

## 2023-10-26 NOTE — Progress Notes (Signed)
 Pt presents for depo injection. Pt within depo window, no urine hcg needed.  Injection given, tolerated well. F/u depo injection visit scheduled.   Last DXA: 01/03/2023 (WNL)   Due for next DXA 01/02/25  Patient last 3 weights:  Wt Readings from Last 3 Encounters:  10/26/23 148 lb 12.8 oz (67.5 kg) (79%, Z= 0.80)*  08/10/23 142 lb 9.6 oz (64.7 kg) (73%, Z= 0.61)*  06/01/23 144 lb 6.4 oz (65.5 kg) (75%, Z= 0.69)*   * Growth percentiles are based on CDC (Girls, 2-20 Years) data.     Last Blood Pressure:   BP Readings from Last 3 Encounters:  10/26/23 113/77  08/23/23 120/77  08/10/23 116/75

## 2024-01-02 ENCOUNTER — Other Ambulatory Visit (HOSPITAL_COMMUNITY)
Admission: RE | Admit: 2024-01-02 | Discharge: 2024-01-02 | Disposition: A | Discharge status: Home / Self Care | Source: Ambulatory Visit | Attending: Pediatrics | Admitting: Pediatrics

## 2024-01-02 ENCOUNTER — Ambulatory Visit

## 2024-01-02 ENCOUNTER — Ambulatory Visit: Admitting: Pediatrics

## 2024-01-02 VITALS — BP 102/70 | Ht 58.07 in | Wt 154.5 lb

## 2024-01-02 DIAGNOSIS — Z113 Encounter for screening for infections with a predominantly sexual mode of transmission: Secondary | ICD-10-CM | POA: Diagnosis present

## 2024-01-02 DIAGNOSIS — J302 Other seasonal allergic rhinitis: Secondary | ICD-10-CM

## 2024-01-02 DIAGNOSIS — Z0000 Encounter for general adult medical examination without abnormal findings: Secondary | ICD-10-CM | POA: Diagnosis not present

## 2024-01-02 DIAGNOSIS — F320 Major depressive disorder, single episode, mild: Secondary | ICD-10-CM

## 2024-01-02 DIAGNOSIS — E6609 Other obesity due to excess calories: Secondary | ICD-10-CM | POA: Diagnosis not present

## 2024-01-02 DIAGNOSIS — L309 Dermatitis, unspecified: Secondary | ICD-10-CM

## 2024-01-02 DIAGNOSIS — Z114 Encounter for screening for human immunodeficiency virus [HIV]: Secondary | ICD-10-CM | POA: Diagnosis not present

## 2024-01-02 DIAGNOSIS — Z6830 Body mass index (BMI) 30.0-30.9, adult: Secondary | ICD-10-CM | POA: Diagnosis not present

## 2024-01-02 DIAGNOSIS — Z30012 Encounter for prescription of emergency contraception: Secondary | ICD-10-CM

## 2024-01-02 DIAGNOSIS — L02214 Cutaneous abscess of groin: Secondary | ICD-10-CM | POA: Diagnosis not present

## 2024-01-02 DIAGNOSIS — E782 Mixed hyperlipidemia: Secondary | ICD-10-CM

## 2024-01-02 DIAGNOSIS — E559 Vitamin D deficiency, unspecified: Secondary | ICD-10-CM | POA: Diagnosis not present

## 2024-01-02 DIAGNOSIS — Z23 Encounter for immunization: Secondary | ICD-10-CM | POA: Diagnosis not present

## 2024-01-02 DIAGNOSIS — F4320 Adjustment disorder, unspecified: Secondary | ICD-10-CM

## 2024-01-02 LAB — POCT RAPID HIV: Rapid HIV, POC: NEGATIVE

## 2024-01-02 MED ORDER — CETIRIZINE HCL 10 MG PO TABS: 10.0000 mg | ORAL_TABLET | Freq: Every day | ORAL | 2 refills | Status: AC

## 2024-01-02 MED ORDER — MUPIROCIN 2 % EX OINT: 1.0000 | TOPICAL_OINTMENT | Freq: Two times a day (BID) | CUTANEOUS | 3 refills | Status: AC

## 2024-01-02 MED ORDER — FLUTICASONE PROPIONATE 50 MCG/ACT NA SUSP: NASAL | 0 refills | Status: AC

## 2024-01-02 MED ORDER — ULIPRISTAL ACETATE 30 MG PO TABS: 1.0000 | ORAL_TABLET | Freq: Once | ORAL | 5 refills | Status: AC | PRN

## 2024-01-02 MED ORDER — TRIAMCINOLONE ACETONIDE 0.5 % EX OINT: 1.0000 | TOPICAL_OINTMENT | Freq: Two times a day (BID) | CUTANEOUS | 0 refills | Status: AC

## 2024-01-02 NOTE — Progress Notes (Signed)
 Adolescent Well Care Visit Sophia [name] is a 18 y.o. female who is here for well care.    PCP:  Artice Mallie Hamilton, MD   History was provided by the patient.  Current Issues: Current concerns include:  still feeling tired all the time.  Feels tired when she was up.  Also feels that her emotions are blunted.  Not enjoying the things that she used to enjoy.    Getting bumps in her groin area that come and go.  They are painful and sometimes open and drain pus/blood.  She currently has a healing one on the right side.  The one on the left has completely healed.   acne is doing better not using any prescriptions currently.   Eczema - on arms recently.  Needs refill on triamcinolone  0.5% ointment.  Contraception - Would like to stop Depo - in concerned about side effects from hormonal contraception.  She is interested in condoms and emergency contraception for pregnancy prevention.  She is sexually active with 1 female partner  Nutrition/Exercise: Nutrition/Eating Behaviors: good appetite, not picky, drinks water Adequate calcium in diet?: yes Supplements/ Vitamins: none Play any Sports?/ Exercise: walking sometimes  Sleep:  Sleep: sleeping through the night - about 9 hours, light snoring  Social Screening: Lives with:  roommates in [place] Parental relations:  good Activities, Work, and Regulatory Affairs Officer?: working at [employer] regarding behavior with peers?  no Stressors of note: no  Education: School Name: [employer]  - studying biology School Grade: 2nd year  Menstruation:   No LMP recorded. Patient has had an injection. Menstrual History: not currently having periods with Depo.   Screenings: PHQ-9 completed and results indicated mild-moderate depressive symptoms.  Discussed with patient.    Physical Exam:  Vitals:   01/02/24 1046  BP: 102/70  Weight: 154 lb 8 oz (70.1 kg)  Height: 4' 10.07 (1.475 m)   BP 102/70 (BP Location: Right Arm, Patient  Position: Sitting, Cuff Size: Normal)   Ht 4' 10.07 (1.475 m)   Wt 154 lb 8 oz (70.1 kg)   BMI 32.21 kg/m  Body mass index: body mass index is 32.21 kg/m. Growth %ile SmartLinks can only be used for patients less than 37 years old.  Hearing Screening   500Hz  1000Hz  2000Hz  4000Hz   Right ear 20 20 20 20   Left ear 20 20 20 20    Vision Screening   Right eye Left eye Both eyes  Without correction 20/20 20/20 20/20   With correction       General Appearance:   alert, oriented, no acute distress and flat affect, responds appropriately to questions  HENT: Normocephalic, no obvious abnormality, conjunctiva clear  Mouth:   Normal appearing teeth, no obvious discoloration, dental caries, or dental caps  Neck:   Supple; thyroid : no enlargement, symmetric, no tenderness/mass/nodules  Chest Not examined  Lungs:   Clear to auscultation bilaterally, normal work of breathing  Heart:   Regular rate and rhythm, S1 and S2 normal, no murmurs;   Abdomen:   Soft, non-tender, no mass, or organomegaly  GU Tanner IV, thickened hyperpigmented scar tissue in the right inguinal crease  Musculoskeletal:   Tone and strength strong and symmetrical, all extremities               Lymphatic:   No cervical adenopathy  Skin/Hair/Nails:   Skin warm, dry and intact, no rashes, no bruises or petechiae  Neurologic:   Strength, gait, and coordination normal and age-appropriate  Assessment and Plan:   1. Encounter for general adult medical examination without abnormal findings (Primary)  2. Routine screening for STI (sexually transmitted infection) - Urine cytology ancillary only  3. Screening for human immunodeficiency virus Routine screening - POCT Rapid HIV - negative  4. Encounter for emergency contraception Discussed contraception options.  Rx provided for Ella  to use if needed for emergency contraception. - ulipristal acetate  (ELLA ) 30 MG tablet; Take 1 tablet (30 mg total) by mouth Once PRN for up  to 1 dose.  Dispense: 1 tablet; Refill: 5  5. Seasonal allergies - fluticasone  (FLONASE ) 50 MCG/ACT nasal spray; Spray once in each nostril once daily as needed. Do not spray directly upward but aim toward outer nare.  Dispense: 16 g; Refill: 0 - cetirizine  (ZYRTEC ) 10 MG tablet; Take 1 tablet (10 mg total) by mouth daily.  Dispense: 34 tablet; Refill: 2  6. Mixed hyperlipidemia Due for repeat fasting labs today.  Discussed lifestyle changes to help with this including increased physical activity and decreased saturated fat in her diet.  Offered nutrition referral which patient declined today. - Hemoglobin A1c  7. Vitamin D  deficiency Has completed 8 weeks of high dose supplementation.  Due for repeat labs today. - VITAMIN D  25 Hydroxy (Vit-D Deficiency, Fractures)  8. Eczema, unspecified type Discussed supportive care with hypoallergenic soap/detergent and regular application of bland emollients.  Reviewed appropriate use of steroid creams and return precautions. - triamcinolone  ointment (KENALOG ) 0.5 %; Apply 1 Application topically 2 (two) times daily. A  Dispense: 30 g; Refill: 0  9. Current mild episode of major depressive disorder without prior episode Recommend counselling, warrm handoff to integrated [facility] today.  Consider referral for outpatient therapy and/or medication management if needed in the future.    10. Cutaneous abscess of groin Resolving abscess noted on exam.  Rx provided for prn use.  Will need to monitor this as she may be developing hidradenitis suppurativa.  Reviewed reasons to return to care. - mupirocin  ointment (BACTROBAN ) 2 %; Apply 1 Application topically 2 (two) times daily. For bumps in groin area.  Dispense: 22 g; Refill: 3  11. Class 1 obesity due to excess calories with serious comorbidity and body mass index (BMI) of 30.0 to 30.9 in adult Patient with recent rapid weight gain.  Discussed healthy habits today including daily physical activity, no daily  sweet drinks, and plenty of fruits and veggies.   Hearing screening result:normal Vision screening result: normal  Counseling provided for all of the vaccine components  Orders Placed This Encounter  Procedures   Flu vaccine trivalent PF, 6mos and older(Flulaval,Afluria,Fluarix,Fluzone)     Return for follow-up mood in 2-3 months with [facility] or Dr. Artice.SABRA Mallie Glendia Artice, MD

## 2024-01-02 NOTE — BH Specialist Note (Signed)
 Integrated Behavioral Health Initial In-Person Visit  MRN: [id number] Name: Sophia [name]  Number of Integrated Behavioral Health Clinician visits: 1- Initial Visit  Session Start time: 1138    Session End time: 1159   Total time in minutes: 21  Types of Service: Individual psychotherapy  Interpretor:No.   Subjective: Sophia [name] is a 18 y.o. female accompanied by self Patient was referred by Dr. Artice for possible mild depression. Patient reports the following symptoms/concerns: Patient reported I don't feel sad or down most of the time. Patient reported she doesn't really feel a lot of things. Patient reported a lack of energy. Patient reported she tries to get 8 hours of sleep a night. Patient reports having friends in college as well as a boyfriend whom she's been with for almost 2 years.  Duration of problem: Unsure; Severity of problem: mild  Objective: Mood: Anxious and Affect: Appropriate Risk of harm to self or others: Did not access, report from physician was no SI  Patient and/or Family's Strengths/Protective Factors: Social connections and Social and Emotional competence  Goals Addressed: Patient will: Demonstrate ability to: Increase healthy adjustment to current life circumstances  Progress towards Goals: Ongoing  Interventions: Interventions utilized: CBT Cognitive Behavioral Therapy  Standardized Assessments completed: Not Needed-completed by physician  Patient and/or Family Response: Patient reported school is okay. She has friends and her boyfriend is also there. She reported having mixed feelings about coming home for Christmas break due to not wanting to decorate for Christmas because it's so much work. Patient was receptive to the CBT activity.   Patient Centered Plan: Patient is on the following Treatment Plan(s):  Adjustment disorder, unspecified type   Clinical Assessment/Diagnosis  Adjustment disorder, unspecified type    Assessment: Patient was alert and engaged. Patient appeared a little anxious but this could be due to meeting the [facility] for the first time and the visit being so brief.    Patient may benefit from Recognizing and challenging negative thoughts that can lead to feelings of anxiety or depression.  Plan: Follow up with behavioral health clinician on : January 23, 2023 9:30 Behavioral recommendations: Recognize and challenge negative thoughts that can lead to anxiety or depression.  Referral(s): Integrated [facility] (In Clinic)  Breyden Jeudy D Marquiz Sotelo

## 2024-01-03 ENCOUNTER — Ambulatory Visit: Payer: Self-pay | Admitting: Pediatrics

## 2024-01-03 ENCOUNTER — Other Ambulatory Visit: Payer: Self-pay | Admitting: Pediatrics

## 2024-01-03 DIAGNOSIS — E559 Vitamin D deficiency, unspecified: Secondary | ICD-10-CM

## 2024-01-03 LAB — URINE CYTOLOGY ANCILLARY ONLY
Chlamydia: NEGATIVE
Comment: NEGATIVE
Comment: NEGATIVE
Comment: NORMAL
Neisseria Gonorrhea: NEGATIVE
Trichomonas: NEGATIVE

## 2024-01-03 LAB — VITAMIN D 25 HYDROXY (VIT D DEFICIENCY, FRACTURES): Vit D, 25-Hydroxy: 22 ng/mL — ABNORMAL LOW (ref 30–100)

## 2024-01-03 LAB — HEMOGLOBIN A1C
Hgb A1c MFr Bld: 5.6 % (ref <5.7)
Mean Plasma Glucose: 114 mg/dL
eAG (mmol/L): 6.3 mmol/L

## 2024-01-03 MED ORDER — VITAMIN D (ERGOCALCIFEROL) 1.25 MG (50000 UNIT) PO CAPS: 50000.0000 [IU] | ORAL_CAPSULE | ORAL | 0 refills | Status: AC

## 2024-01-08 ENCOUNTER — Encounter: Admitting: Family

## 2024-01-17 NOTE — BH Specialist Note (Deleted)
 Integrated Behavioral Health Initial In-Person Visit  MRN: [id number] Name: Sophia [name]  Number of Integrated Behavioral Health Clinician visits: 1- Initial Visit  Session Start time: 1138    Session End time: 1159  Total time in minutes: 21  Types of Service: {CHL AMB TYPE OF SERVICE:[phone]}  Interpretor:No.   Subjective: Sophia [name] is a 18 y.o. female accompanied by {CHL AMB ACCOMPANIED AB:[id number]} Patient was referred by Dr. Artice for possible mild depression. Patient reports the following symptoms/concerns: *** Duration of problem: ***; Severity of problem: {Mild/Moderate/Severe:20260}  Objective: Mood: {BHH MOOD:22306} and Affect: {BHH AFFECT:22307} Risk of harm to self or others: {CHL AMB BH Suicide Current Mental Status:21022748}  Life Context: Family and Social: *** School/Work: *** Self-Care: *** Life Changes: ***  Patient and/or Family's Strengths/Protective Factors: {CHL AMB BH PROTECTIVE FACTORS:[phone]}  Goals Addressed: Patient will: Reduce symptoms of: {IBH Symptoms:21014056} Increase knowledge and/or ability of: {IBH Patient Tools:21014057}  Demonstrate ability to: {IBH Goals:21014053}  Progress towards Goals: {CHL AMB BH PROGRESS TOWARDS GOALS:[phone]}  Interventions: Interventions utilized: {IBH Interventions:21014054}  Standardized Assessments completed: {IBH Screening Tools:21014051}  Patient and/or Family Response: ***  Patient Centered Plan: Patient is on the following Treatment Plan(s):  ***  Clinical Assessment/Diagnosis  No diagnosis found.   Assessment: Patient currently experiencing ***.   Patient may benefit from ***.  Plan: Follow up with behavioral health clinician on : *** Behavioral recommendations: *** Referral(s): {IBH Referrals:21014055}  Channing BIRCH Avianah Pellman

## 2024-01-23 ENCOUNTER — Institutional Professional Consult (permissible substitution)

## 2024-03-26 ENCOUNTER — Ambulatory Visit: Admitting: Pediatrics
